# Patient Record
Sex: Female | Born: 1995 | Race: Black or African American | Hispanic: No | Marital: Single | Smoking: Never smoker
Health system: Southern US, Community
[De-identification: ages and names within clinical notes are randomized; demographics above are authoritative.]

---

## 2015-03-01 ENCOUNTER — Emergency Department (HOSPITAL_BASED_OUTPATIENT_CLINIC_OR_DEPARTMENT_OTHER)
Admission: EM | Admit: 2015-03-01 | Discharge: 2015-03-01 | Disposition: A | Discharge status: Home / Self Care | Payer: Medicaid Other | Attending: Emergency Medicine | Admitting: Emergency Medicine

## 2015-03-01 ENCOUNTER — Emergency Department (HOSPITAL_BASED_OUTPATIENT_CLINIC_OR_DEPARTMENT_OTHER): Payer: Medicaid Other

## 2015-03-01 ENCOUNTER — Encounter (HOSPITAL_BASED_OUTPATIENT_CLINIC_OR_DEPARTMENT_OTHER): Payer: Self-pay

## 2015-03-01 DIAGNOSIS — R10A0 Flank pain, unspecified side: Secondary | ICD-10-CM

## 2015-03-01 DIAGNOSIS — R630 Anorexia: Secondary | ICD-10-CM | POA: Diagnosis not present

## 2015-03-01 DIAGNOSIS — R000 Tachycardia, unspecified: Secondary | ICD-10-CM | POA: Insufficient documentation

## 2015-03-01 DIAGNOSIS — R109 Unspecified abdominal pain: Secondary | ICD-10-CM

## 2015-03-01 DIAGNOSIS — E860 Dehydration: Secondary | ICD-10-CM | POA: Diagnosis not present

## 2015-03-01 DIAGNOSIS — R1032 Left lower quadrant pain: Secondary | ICD-10-CM | POA: Diagnosis present

## 2015-03-01 DIAGNOSIS — N12 Tubulo-interstitial nephritis, not specified as acute or chronic: Secondary | ICD-10-CM

## 2015-03-01 DIAGNOSIS — N939 Abnormal uterine and vaginal bleeding, unspecified: Secondary | ICD-10-CM | POA: Diagnosis not present

## 2015-03-01 DIAGNOSIS — Z3202 Encounter for pregnancy test, result negative: Secondary | ICD-10-CM | POA: Insufficient documentation

## 2015-03-01 LAB — COMPREHENSIVE METABOLIC PANEL
ALBUMIN: 3.8 g/dL (ref 3.5–5.0)
ALK PHOS: 69 U/L (ref 38–126)
ALT: 12 U/L — AB (ref 14–54)
ANION GAP: 8 (ref 5–15)
AST: 18 U/L (ref 15–41)
BUN: 11 mg/dL (ref 6–20)
CALCIUM: 9.2 mg/dL (ref 8.9–10.3)
CHLORIDE: 101 mmol/L (ref 101–111)
CO2: 27 mmol/L (ref 22–32)
CREATININE: 0.66 mg/dL (ref 0.44–1.00)
GFR calc non Af Amer: >60 mL/min (ref >60)
GLUCOSE: 96 mg/dL (ref 65–99)
Potassium: 3.1 mmol/L — ABNORMAL LOW (ref 3.5–5.1)
SODIUM: 136 mmol/L (ref 135–145)
Total Bilirubin: 0.3 mg/dL (ref 0.3–1.2)
Total Protein: 7.7 g/dL (ref 6.5–8.1)

## 2015-03-01 LAB — URINALYSIS, ROUTINE W REFLEX MICROSCOPIC
Glucose, UA: NEGATIVE mg/dL
KETONES UR: 40 mg/dL — AB
NITRITE: NEGATIVE
PROTEIN: 30 mg/dL — AB
Specific Gravity, Urine: 1.031 — ABNORMAL HIGH (ref 1.005–1.030)
pH: 6 (ref 5.0–8.0)

## 2015-03-01 LAB — LIPASE, BLOOD: Lipase: 18 U/L (ref 11–51)

## 2015-03-01 LAB — URINE MICROSCOPIC-ADD ON

## 2015-03-01 LAB — CBC WITH DIFFERENTIAL/PLATELET
BASOS PCT: 0 %
Basophils Absolute: 0 10*3/uL (ref 0.0–0.1)
EOS ABS: 0 10*3/uL (ref 0.0–0.7)
EOS PCT: 0 %
HCT: 34 % — ABNORMAL LOW (ref 36.0–46.0)
HEMOGLOBIN: 11.1 g/dL — AB (ref 12.0–15.0)
Lymphocytes Relative: 13 %
Lymphs Abs: 1.2 10*3/uL (ref 0.7–4.0)
MCH: 28.7 pg (ref 26.0–34.0)
MCHC: 32.6 g/dL (ref 30.0–36.0)
MCV: 87.9 fL (ref 78.0–100.0)
Monocytes Absolute: 0.7 10*3/uL (ref 0.1–1.0)
Monocytes Relative: 7 %
NEUTROS PCT: 80 %
Neutro Abs: 7.6 10*3/uL (ref 1.7–7.7)
PLATELETS: 332 10*3/uL (ref 150–400)
RBC: 3.87 MIL/uL (ref 3.87–5.11)
RDW: 14 % (ref 11.5–15.5)
WBC: 9.6 10*3/uL (ref 4.0–10.5)

## 2015-03-01 LAB — I-STAT CG4 LACTIC ACID, ED: LACTIC ACID, VENOUS: 1.34 mmol/L (ref 0.5–2.0)

## 2015-03-01 LAB — PREGNANCY, URINE: PREG TEST UR: NEGATIVE

## 2015-03-01 MED ORDER — HYDROMORPHONE HCL 1 MG/ML IJ SOLN
1.0000 mg | Freq: Once | INTRAMUSCULAR | Status: AC
  Administered 2015-03-01: 1 mg via INTRAVENOUS
  Filled 2015-03-01: qty 1

## 2015-03-01 MED ORDER — ONDANSETRON HCL 4 MG/2ML IJ SOLN
4.0000 mg | Freq: Once | INTRAMUSCULAR | Status: AC
  Administered 2015-03-01: 4 mg via INTRAVENOUS
  Filled 2015-03-01: qty 2

## 2015-03-01 MED ORDER — GI COCKTAIL ~~LOC~~
30.0000 mL | Freq: Once | ORAL | Status: AC
  Administered 2015-03-01: 30 mL via ORAL
  Filled 2015-03-01: qty 30

## 2015-03-01 MED ORDER — DEXTROSE 5 % IV SOLN
1.0000 g | Freq: Once | INTRAVENOUS | Status: AC
  Administered 2015-03-01: 1 g via INTRAVENOUS

## 2015-03-01 MED ORDER — HYDROCODONE-ACETAMINOPHEN 5-325 MG PO TABS: 1.0000 | ORAL_TABLET | Freq: Four times a day (QID) | ORAL | Status: DC | PRN

## 2015-03-01 MED ORDER — CEPHALEXIN 500 MG PO CAPS: 500.0000 mg | ORAL_CAPSULE | Freq: Four times a day (QID) | ORAL | Status: DC

## 2015-03-01 MED ORDER — ONDANSETRON HCL 4 MG PO TABS: 4.0000 mg | ORAL_TABLET | Freq: Four times a day (QID) | ORAL | Status: DC

## 2015-03-01 MED ORDER — SODIUM CHLORIDE 0.9 % IV BOLUS (SEPSIS)
1000.0000 mL | Freq: Once | INTRAVENOUS | Status: AC
  Administered 2015-03-01: 1000 mL via INTRAVENOUS

## 2015-03-01 MED ORDER — MORPHINE SULFATE (PF) 4 MG/ML IV SOLN
4.0000 mg | Freq: Once | INTRAVENOUS | Status: AC
  Administered 2015-03-01: 4 mg via INTRAVENOUS
  Filled 2015-03-01: qty 1

## 2015-03-01 MED ORDER — CEFTRIAXONE SODIUM 1 G IJ SOLR
INTRAMUSCULAR | Status: AC
  Administered 2015-03-01: 1000 mg
  Filled 2015-03-01: qty 10

## 2015-03-01 MED FILL — ONDANSETRON HCL 4 MG TABLET: 4 | 3 days supply | Qty: 12 | Fill #0

## 2015-03-01 MED FILL — CEPHALEXIN 500 MG CAPSULE: 500 | 7 days supply | Qty: 28 | Fill #0

## 2015-03-01 MED FILL — HYDROCODON-APAP 5-325: 5-325 | 2 days supply | Qty: 15 | Fill #0

## 2015-03-01 NOTE — ED Provider Notes (Addendum)
CSN: [id number]     Arrival date & time 03/01/15  1201 History   First MD Initiated Contact with Patient 03/01/15 1212     Chief Complaint  Patient presents with  . Abdominal Pain     (Consider location/radiation/quality/duration/timing/severity/associated sxs/prior Treatment) Patient is a 20 y.o. female presenting with abdominal pain. The history is provided by the patient.  Abdominal Pain Pain location:  L flank, LLQ and LUQ Pain quality: sharp, shooting and throbbing   Pain radiates to:  Does not radiate Pain severity:  Moderate Onset quality:  Gradual Duration:  2 days Timing:  Constant Progression:  Worsening Chronicity:  New Context: alcohol use   Context comment:  Started after heavy alcohol use several days ago Relieved by:  Nothing Worsened by:  Eating Ineffective treatments: Her mom gave her a tramadol but it made symptoms worse. Associated symptoms: anorexia, chills, nausea, vaginal bleeding and vomiting   Associated symptoms: no constipation, no diarrhea, no dysuria, no hematuria, no shortness of breath and no vaginal discharge   Risk factors: alcohol abuse   Risk factors: has not had multiple surgeries, not pregnant and no recent hospitalization     History reviewed. No pertinent past medical history. History reviewed. No pertinent past surgical history. No family history on file. Social History  Substance Use Topics  . Smoking status: Never Smoker   . Smokeless tobacco: None  . Alcohol Use: No   OB History    No data available     Review of Systems  Constitutional: Positive for chills.  Respiratory: Negative for shortness of breath.   Gastrointestinal: Positive for nausea, vomiting, abdominal pain and anorexia. Negative for diarrhea and constipation.  Genitourinary: Positive for vaginal bleeding. Negative for dysuria, hematuria and vaginal discharge.  All other systems reviewed and are negative.     Allergies  Review of patient's allergies  indicates no known allergies.  Home Medications   Prior to Admission medications   Not on File   BP 123/83 mmHg  Pulse 130  Temp(Src) 98.6 F (37 C) (Oral)  Resp 18  Ht  (1.499 m)  Wt 120 lb (54.432 kg)  BMI 24.22 kg/m2  SpO2 99%  LMP  (LMP Unknown) Physical Exam  Constitutional: She is oriented to person, place, and time. She appears well-developed and well-nourished. No distress.  HENT:  Head: Normocephalic and atraumatic.  Mouth/Throat: Oropharynx is clear and moist. Mucous membranes are dry.  Eyes: Conjunctivae and EOM are normal. Pupils are equal, round, and reactive to light.  Neck: Normal range of motion. Neck supple.  Cardiovascular: Regular rhythm and intact distal pulses.  Tachycardia present.   No murmur heard. Pulmonary/Chest: Effort normal and breath sounds normal. No respiratory distress. She has no wheezes. She has no rales.  Abdominal: Soft. She exhibits no distension. There is tenderness in the left upper quadrant and left lower quadrant. There is CVA tenderness. There is no rebound and no guarding.  Musculoskeletal: Normal range of motion. She exhibits no edema or tenderness.  Neurological: She is alert and oriented to person, place, and time.  Skin: Skin is warm and dry. No rash noted. No erythema.  Psychiatric: She has a normal mood and affect. Her behavior is normal.  Nursing note and vitals reviewed.   ED Course  Procedures (including critical care time) Labs Review Labs Reviewed  URINALYSIS, ROUTINE W REFLEX MICROSCOPIC (NOT AT [facility]) - Abnormal; Notable for the following:    Color, Urine AMBER (*)    APPearance CLOUDY (*)  Specific Gravity, Urine 1.031 (*)    Hgb urine dipstick LARGE (*)    Bilirubin Urine SMALL (*)    Ketones, ur 40 (*)    Protein, ur 30 (*)    Leukocytes, UA MODERATE (*)    All other components within normal limits  CBC WITH DIFFERENTIAL/PLATELET - Abnormal; Notable for the following:    Hemoglobin 11.1 (*)    HCT  34.0 (*)    All other components within normal limits  COMPREHENSIVE METABOLIC PANEL - Abnormal; Notable for the following:    Potassium 3.1 (*)    ALT 12 (*)    All other components within normal limits  URINE MICROSCOPIC-ADD ON - Abnormal; Notable for the following:    Squamous Epithelial / LPF 0-5 (*)    Bacteria, UA FEW (*)    All other components within normal limits  PREGNANCY, URINE  LIPASE, BLOOD  I-STAT CG4 LACTIC ACID, ED    Imaging Review Ct Renal Stone Study  03/01/2015  CLINICAL DATA:  Bilateral flank and lateral abdominal pain.  Nausea. EXAM: CT ABDOMEN AND PELVIS WITHOUT CONTRAST TECHNIQUE: Multidetector CT imaging of the abdomen and pelvis was performed following the standard protocol without IV contrast. COMPARISON:  None. FINDINGS: Lower chest:  Mild bilateral dependent atelectasis Hepatobiliary: Normal non contrasted appearance of the gallbladder and liver. Pancreas: No mass or inflammatory process identified on this un-enhanced exam. Spleen: Within normal limits in size. Adrenals/Urinary Tract: Normal appearing adrenal glands, kidneys, ureters and urinary bladder. No urinary tract calculi or hydronephrosis. Bilateral pelvic phleboliths are noted. Stomach/Bowel: No gastrointestinal abnormalities. Normal appearing appendix. Vascular/Lymphatic: No pathologically enlarged lymph nodes. No evidence of abdominal aortic aneurysm. Reproductive: Normal appearing uterus and ovaries. Small amount of free peritoneal fluid in the pelvic cul-de-sac, within normal limits of physiological fluid. Other: None. Musculoskeletal:  No suspicious bone lesions identified. IMPRESSION: Normal examination. Electronically Signed   By: Beckie Salts M.D.   On: 03/01/2015 13:50   I have personally reviewed and evaluated these images and lab results as part of my medical decision-making.   EKG Interpretation None      MDM   Final diagnoses:  Pyelonephritis  Dehydration    Patient presenting with  2 days of abdominal pain, vomiting and inability to hold anything down. Patient states symptoms started shortly after she had a night of heavy drinking. She denies any prior abdominal pain similar to this and no prior surgeries. She has noticed her urine has been very dark over the last few days but denies pain with urination or vaginal discharge.  She has no chest pain or shortness of breath.  On exam patient has most significant left upper and lower quadrant tenderness. Concern for possible alcoholic pancreatitis, kidney stone, pyelonephritis, colitis. Low suspicion for obstruction at this time. Also concern for direct dehydration. Serum pregnancy test is negative with low suspicion of an ectopic. UA with moderate ascites in 6-30 white blood cells and large blood.  Lactic acid within normal limits and CBC within normal limits. CMP and lipase without acute findings.. Patient given IV fluids, Zofran and pain control  No evidence of hepatitis, pancreatitis and low suspicion for gastritis. However concern for possible kidney stone versus pyelonephritis. CT to further evaluate.  2:22 PM CT neg for acute findings.  Will treat for pyelonephritis.  On re-eval pt still having pain  But vomiting and tachycardia improved.  Pt given rocephin, a second L of NS and pain control.  3:19 PM Pt feeling better after  second round of pain control.  Drinking fluids without further vomiting.  Will d/c home. Gwyneth Sprout, MD 03/01/15 1440  Gwyneth Sprout, MD 03/01/15 1520

## 2015-03-01 NOTE — ED Notes (Addendum)
abd pain x 2 days-n/v-denies diarrhea-NAD-steady gait to triage

## 2017-06-02 IMAGING — CT CT RENAL STONE PROTOCOL
2 of 4 series · 16 of 46 positions shown, 18 images · non-contrast
Comparison: None.

CLINICAL DATA: Bilateral flank and lateral abdominal pain.  Nausea.

EXAM:
CT ABDOMEN AND PELVIS WITHOUT CONTRAST
TECHNIQUE: Multidetector CT imaging of the abdomen and pelvis was performed
following the standard protocol without IV contrast.

[Series 2: axial st · axial · 0.62mm/px · z∈[-667,-267]mm · 13 of 88 slices shown, 15 images]
[im 4/88  soft-tissue]
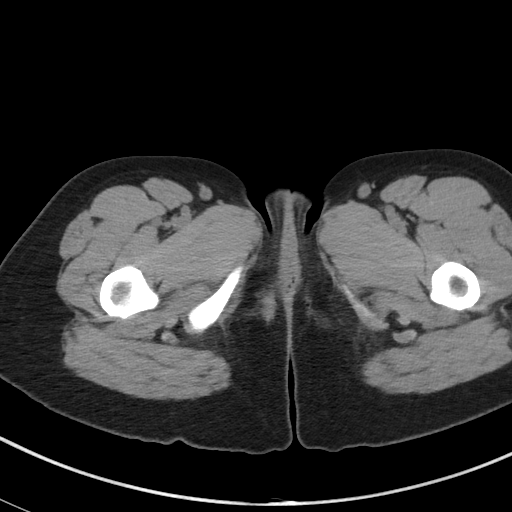
[im 4/88  bone]
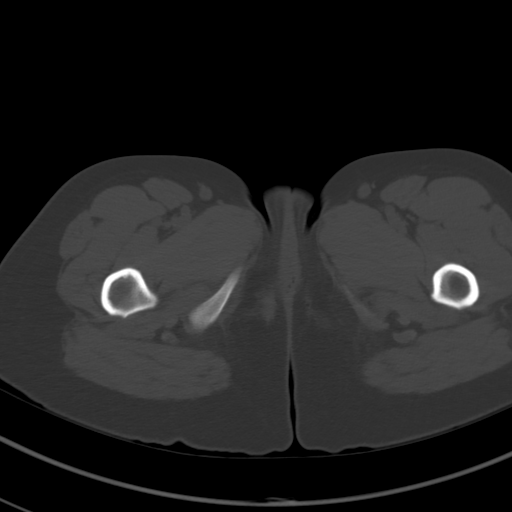
[im 11/88  soft-tissue]
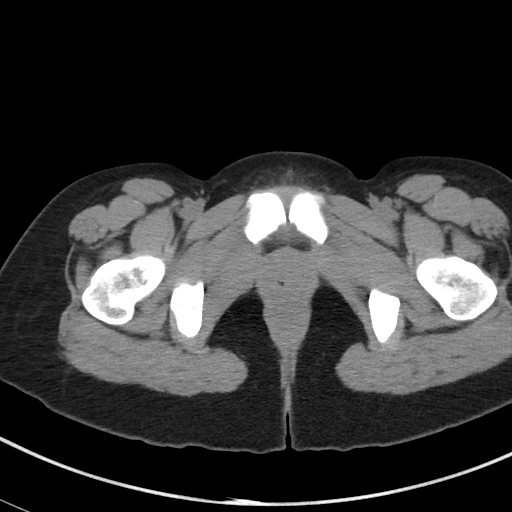
[im 19/88  soft-tissue]
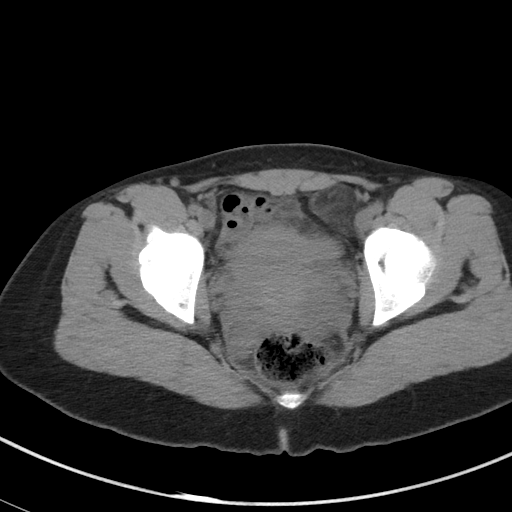
[im 26/88  soft-tissue]
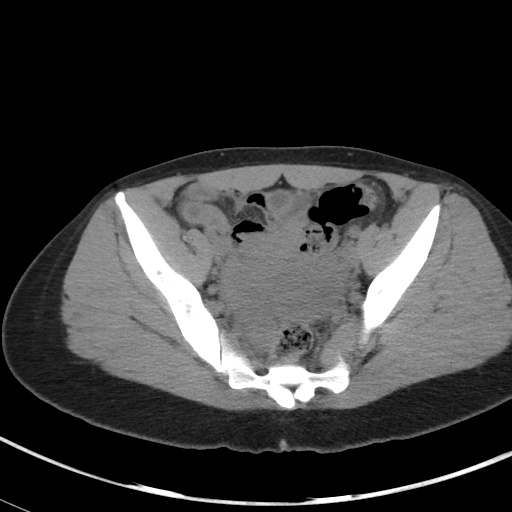
[im 30/88  soft-tissue]
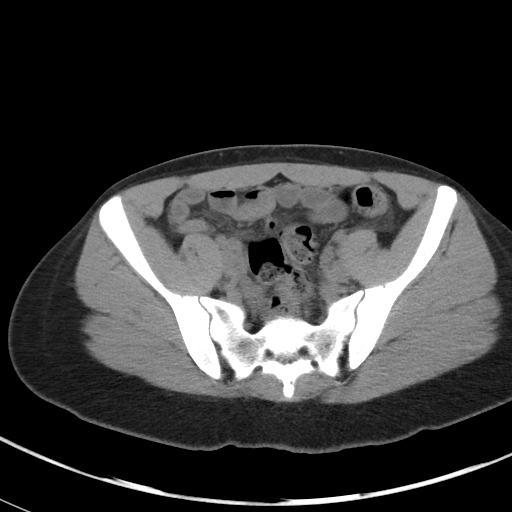
[im 37/88  soft-tissue]
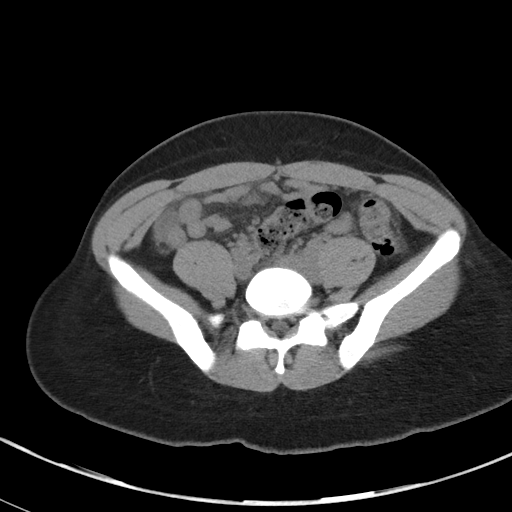
[im 44/88  soft-tissue]
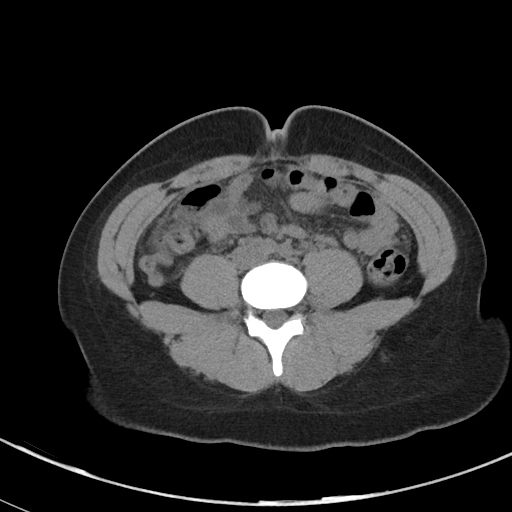
[im 51/88  soft-tissue]
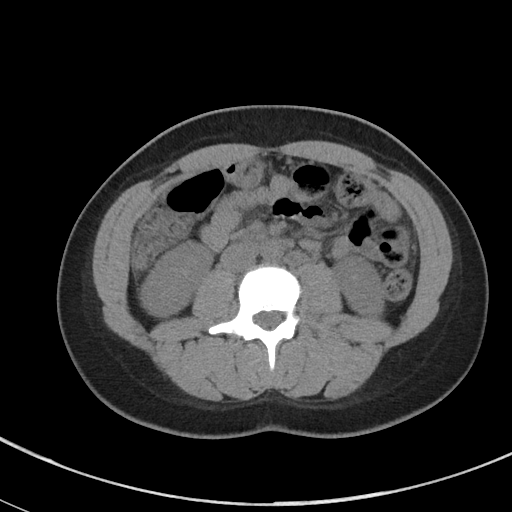
[im 59/88  soft-tissue]
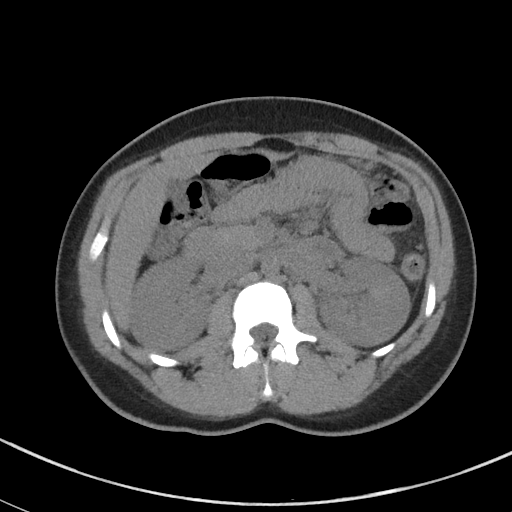
[im 59/88  bone]
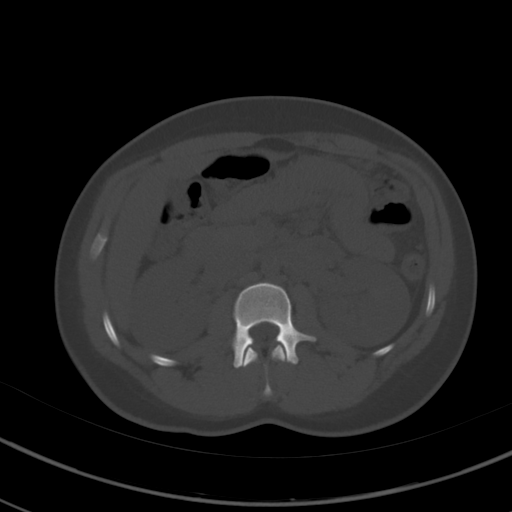
[im 62/88  soft-tissue]
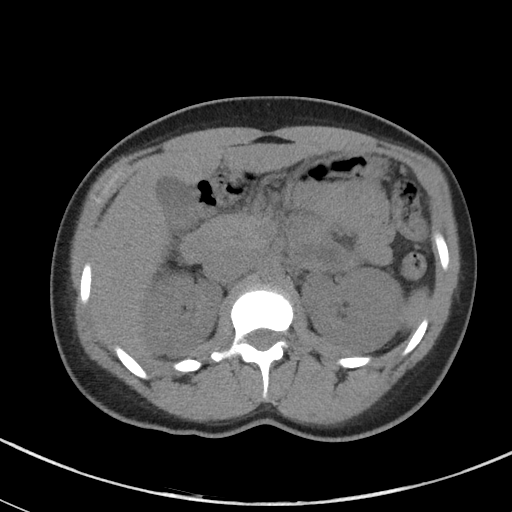
[im 69/88  soft-tissue]
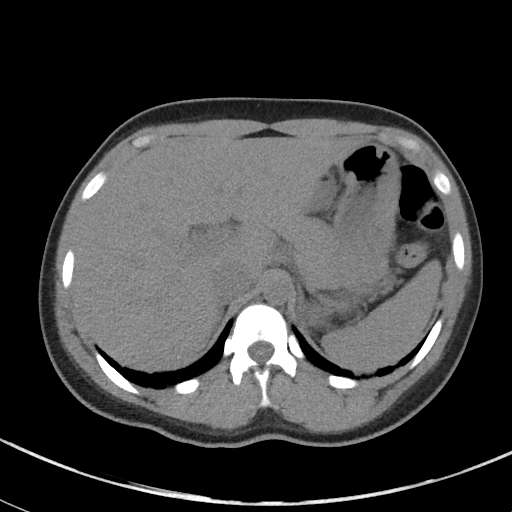
[im 77/88  soft-tissue]
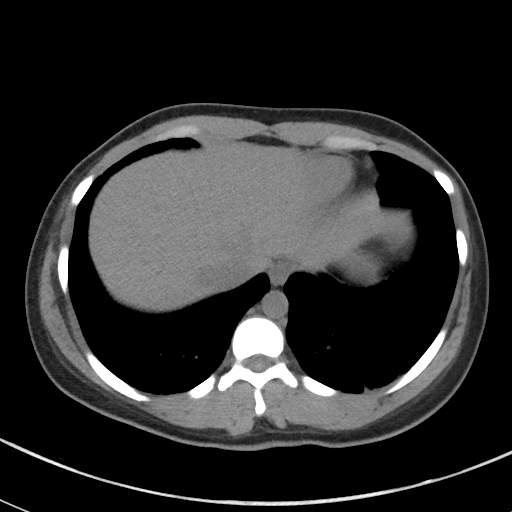
[im 84/88  soft-tissue]
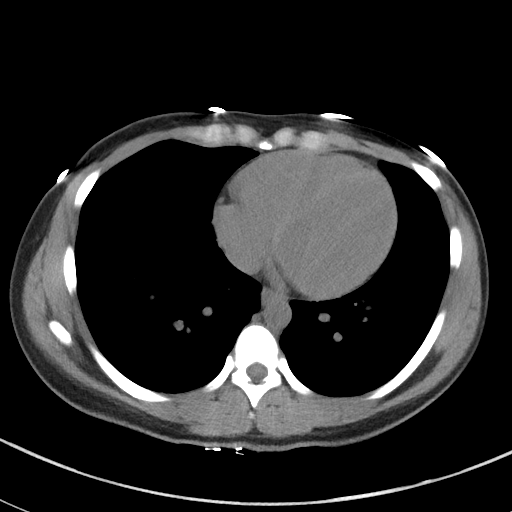

[Series 5: coronal st · coronal · 0.66mm/px · 3 of 72 slices shown]
[im 24/72  soft-tissue]
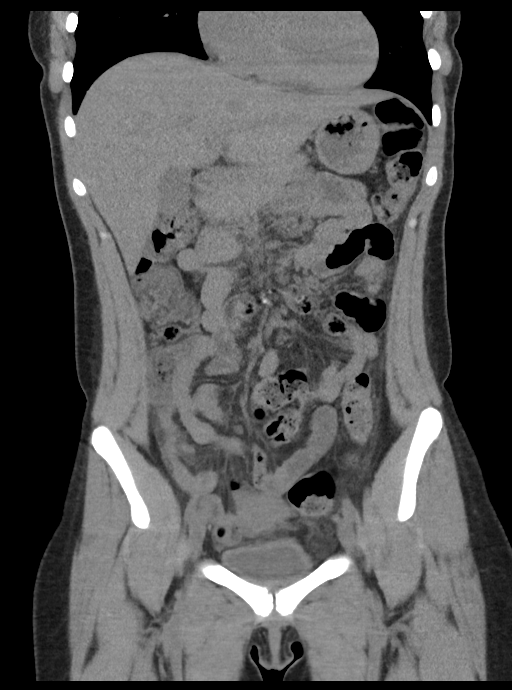
[im 32/72  soft-tissue]
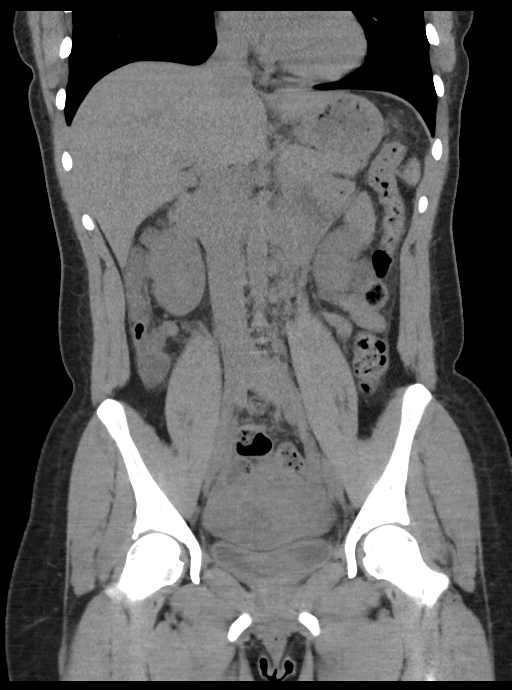
[im 40/72  soft-tissue]
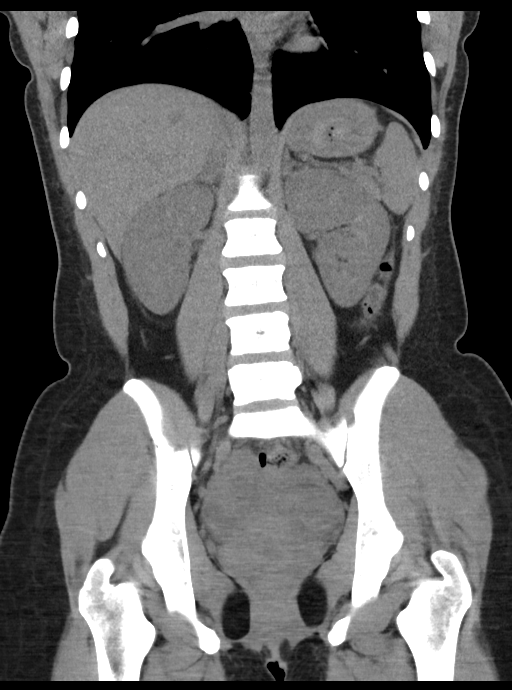

[16 of 46 positions shown; findings below may reference images not displayed]

FINDINGS: Lower chest:  Mild bilateral dependent atelectasis

Hepatobiliary: Normal non contrasted appearance of the gallbladder
and liver.

Pancreas: No mass or inflammatory process identified on this
un-enhanced exam.

Spleen: Within normal limits in size.

Adrenals/Urinary Tract: Normal appearing adrenal glands, kidneys,
ureters and urinary bladder. No urinary tract calculi or
hydronephrosis. Bilateral pelvic phleboliths are noted.

Stomach/Bowel: No gastrointestinal abnormalities. Normal appearing
appendix.

Vascular/Lymphatic: No pathologically enlarged lymph nodes. No
evidence of abdominal aortic aneurysm.

Reproductive: Normal appearing uterus and ovaries. Small amount of
free peritoneal fluid in the pelvic cul-de-sac, within normal limits
of physiological fluid.

Other: None.

Musculoskeletal:  No suspicious bone lesions identified.
IMPRESSION: Normal examination.

## 2019-10-19 ENCOUNTER — Ambulatory Visit
Admission: EM | Admit: 2019-10-19 | Discharge: 2019-10-19 | Disposition: A | Discharge status: Home / Self Care | Payer: Self-pay | Attending: Family Medicine | Admitting: Family Medicine

## 2019-10-19 ENCOUNTER — Other Ambulatory Visit: Payer: Self-pay

## 2019-10-19 ENCOUNTER — Encounter: Payer: Self-pay | Admitting: Emergency Medicine

## 2019-10-19 DIAGNOSIS — Z3490 Encounter for supervision of normal pregnancy, unspecified, unspecified trimester: Secondary | ICD-10-CM

## 2019-10-19 DIAGNOSIS — O219 Vomiting of pregnancy, unspecified: Secondary | ICD-10-CM

## 2019-10-19 LAB — PREGNANCY, URINE: Preg Test, Ur: POSITIVE — AB

## 2019-10-19 MED ORDER — DOXYLAMINE-PYRIDOXINE 10-10 MG PO TBEC: DELAYED_RELEASE_TABLET | ORAL | 3 refills | Status: AC

## 2019-10-19 MED ORDER — PRENATAL VITAMINS 28-0.8 MG PO TABS: 1.0000 | ORAL_TABLET | Freq: Every day | ORAL | 3 refills | Status: AC

## 2019-10-19 NOTE — ED Provider Notes (Signed)
[facility]    CSN: [id number] Arrival date & time: 10/19/19  1118      History   Chief Complaint Chief Complaint  Patient presents with   Nausea   Possible Pregnancy   HPI  24 year old female presents with nausea, vomiting, and late menses.   Patient reports she has had a 2-week history of nausea. She has had some vomiting as well. Her last menstrual period was on July 20th. Patient has not taken a pregnancy test. She is currently sexually active. She is concerned she may be pregnant. No other reported symptoms. No other complaints.  Home Medications    Prior to Admission medications   Medication Sig Start Date End Date Taking? Authorizing Provider  Doxylamine-Pyridoxine 10-10 MG TBEC 2 at bedtime on day 1 & 2; if symptoms persist, take 1 tablet in am and 2 tablets at bedtime on day 3; if symptoms persist, may increase to 1 tablet in am, 1 tablet afternoon, and 2 tablets at bedtime on day 4 10/19/19   Tommie Sams, DO  Prenatal Vit-Fe Fumarate-FA (PRENATAL VITAMINS) 28-0.8 MG TABS Take 1 tablet by mouth daily. 10/19/19   Tommie Sams, DO    Family History Family History  Problem Relation Age of Onset   Healthy Mother    Healthy Father     Social History Social History   Tobacco Use   Smoking status: Never Smoker   Smokeless tobacco: Never Used  Substance Use Topics   Alcohol use: No   Drug use: No     Allergies   Patient has no known allergies.   Review of Systems Review of Systems  Gastrointestinal: Positive for nausea and vomiting.   Physical Exam Triage Vital Signs ED Triage Vitals  Enc Vitals Group     BP 10/19/19 1243 127/86     Pulse Rate 10/19/19 1243 (!) 127     Resp 10/19/19 1243 18     Temp 10/19/19 1243 99.2 F (37.3 C)     Temp Source 10/19/19 1243 Oral     SpO2 10/19/19 1243 100 %     Weight 10/19/19 1241 194 lb (88 kg)     Height 10/19/19 1241 5\' 2"  (1.575 m)     Head Circumference --      Peak Flow --       Pain Score 10/19/19 1240 0     Pain Loc --      Pain Edu? --      Excl. in GC? --    Updated Vital Signs BP 127/86 (BP Location: Left Arm)    Pulse (!) 127    Temp 99.2 F (37.3 C) (Oral)    Resp 18    Ht 5\' 2"  (1.575 m)    Wt 88 kg    LMP 08/24/2019    SpO2 100%    BMI 35.48 kg/m   Visual Acuity Right Eye Distance:   Left Eye Distance:   Bilateral Distance:    Right Eye Near:   Left Eye Near:    Bilateral Near:     Physical Exam Vitals and nursing note reviewed.  Constitutional:      General: She is not in acute distress.    Appearance: Normal appearance. She is not ill-appearing.  HENT:     Head: Normocephalic and atraumatic.  Eyes:     General:        Right eye: No discharge.        Left eye: No discharge.  Conjunctiva/sclera: Conjunctivae normal.  Cardiovascular:     Rate and Rhythm: Regular rhythm. Tachycardia present.  Pulmonary:     Effort: Pulmonary effort is normal.     Breath sounds: Normal breath sounds. No wheezing, rhonchi or rales.  Neurological:     Mental Status: She is alert.  Psychiatric:        Mood and Affect: Mood normal.        Behavior: Behavior normal.    UC Treatments / Results  Labs (all labs ordered are listed, but only abnormal results are displayed) Labs Reviewed  PREGNANCY, URINE - Abnormal; Notable for the following components:      Result Value   Preg Test, Ur POSITIVE (*)    All other components within normal limits    EKG   Radiology No results found.  Procedures Procedures (including critical care time)  Medications Ordered in UC Medications - No data to display  Initial Impression / Assessment and Plan / UC Course  I have reviewed the triage vital signs and the nursing notes.  Pertinent labs & imaging results that were available during my care of the patient were reviewed by me and considered in my medical decision making (see chart for details).    24 year old female presents with nausea, vomiting and  pregnancy. Positive pregnancy test today. Patient given information regarding local OB/GYN as well as [facility]. Diclegis as prescribed. Placing on prenatal as well.  Final Clinical Impressions(s) / UC Diagnoses   Final diagnoses:  Pregnancy, unspecified gestational age  Nausea and vomiting during pregnancy     Discharge Instructions     [facility] (depending on your choice).  Medication as prescribed.  Take care  Dr. Adriana Simas    ED Prescriptions    Medication Sig Dispense Auth. Provider   Doxylamine-Pyridoxine 10-10 MG TBEC 2 at bedtime on day 1 & 2; if symptoms persist, take 1 tablet in am and 2 tablets at bedtime on day 3; if symptoms persist, may increase to 1 tablet in am, 1 tablet afternoon, and 2 tablets at bedtime on day 4 60 tablet Tamsin Nader G, DO   Prenatal Vit-Fe Fumarate-FA (PRENATAL VITAMINS) 28-0.8 MG TABS Take 1 tablet by mouth daily. 90 tablet Everlene Other G, DO     PDMP not reviewed this encounter.   Tommie Sams, [place] 10/19/19 1603

## 2019-10-19 NOTE — Discharge Instructions (Signed)
Contact [facility] or [facility] for an appt (depending on your choice).  Medication as prescribed.  Take care  Dr. Adriana Simas

## 2019-10-19 NOTE — ED Triage Notes (Signed)
Patient c/o nausea that started 2 weeks ago. Patient reports 1 episode of vomiting today. She is concerned that she may be pregnant.
